# Patient Record
Sex: Female | Born: 1975 | Race: White | Hispanic: No | Marital: Married | Smoking: Never smoker
Health system: Southern US, Community
[De-identification: ages and names within clinical notes are randomized; demographics above are authoritative.]

## PROBLEM LIST (undated history)

## (undated) HISTORY — PX: TUBAL LIGATION: SHX77

---

## 1997-09-12 ENCOUNTER — Encounter: Admission: RE | Admit: 1997-09-12 | Discharge: 1997-09-12 | Payer: Self-pay | Admitting: Family Medicine

## 1997-09-14 ENCOUNTER — Encounter: Admission: RE | Admit: 1997-09-14 | Discharge: 1997-09-14 | Payer: Self-pay | Admitting: Family Medicine

## 1997-10-17 ENCOUNTER — Encounter: Admission: RE | Admit: 1997-10-17 | Discharge: 1997-10-17 | Payer: Self-pay | Admitting: Family Medicine

## 1998-06-19 ENCOUNTER — Encounter: Admission: RE | Admit: 1998-06-19 | Discharge: 1998-06-19 | Payer: Self-pay | Admitting: Family Medicine

## 2001-02-03 ENCOUNTER — Other Ambulatory Visit: Admission: RE | Admit: 2001-02-03 | Discharge: 2001-02-03 | Payer: Self-pay | Admitting: Obstetrics and Gynecology

## 2001-03-22 ENCOUNTER — Encounter: Payer: Self-pay | Admitting: Obstetrics and Gynecology

## 2001-03-22 ENCOUNTER — Ambulatory Visit (HOSPITAL_COMMUNITY): Admission: RE | Admit: 2001-03-22 | Discharge: 2001-03-22 | Payer: Self-pay | Admitting: Obstetrics and Gynecology

## 2001-03-25 ENCOUNTER — Encounter: Payer: Self-pay | Admitting: Obstetrics and Gynecology

## 2001-03-25 ENCOUNTER — Ambulatory Visit (HOSPITAL_COMMUNITY): Admission: RE | Admit: 2001-03-25 | Discharge: 2001-03-25 | Payer: Self-pay | Admitting: Obstetrics and Gynecology

## 2001-08-21 ENCOUNTER — Inpatient Hospital Stay (HOSPITAL_COMMUNITY): Admission: AD | Admit: 2001-08-21 | Discharge: 2001-08-23 | Payer: Self-pay | Admitting: Obstetrics and Gynecology

## 2001-08-24 ENCOUNTER — Encounter: Admission: RE | Admit: 2001-08-24 | Discharge: 2001-09-23 | Payer: Self-pay | Admitting: Obstetrics and Gynecology

## 2001-10-24 ENCOUNTER — Encounter: Admission: RE | Admit: 2001-10-24 | Discharge: 2001-11-23 | Payer: Self-pay | Admitting: Obstetrics and Gynecology

## 2002-07-25 ENCOUNTER — Other Ambulatory Visit: Admission: RE | Admit: 2002-07-25 | Discharge: 2002-07-25 | Payer: Self-pay | Admitting: Obstetrics and Gynecology

## 2002-09-12 ENCOUNTER — Ambulatory Visit (HOSPITAL_COMMUNITY): Admission: RE | Admit: 2002-09-12 | Discharge: 2002-09-12 | Payer: Self-pay | Admitting: Obstetrics and Gynecology

## 2002-09-12 ENCOUNTER — Encounter: Payer: Self-pay | Admitting: Obstetrics and Gynecology

## 2002-11-07 ENCOUNTER — Ambulatory Visit (HOSPITAL_COMMUNITY): Admission: RE | Admit: 2002-11-07 | Discharge: 2002-11-07 | Payer: Self-pay | Admitting: Obstetrics and Gynecology

## 2002-11-07 ENCOUNTER — Encounter: Payer: Self-pay | Admitting: Obstetrics and Gynecology

## 2002-11-22 ENCOUNTER — Encounter: Payer: Self-pay | Admitting: Obstetrics and Gynecology

## 2002-11-22 ENCOUNTER — Ambulatory Visit (HOSPITAL_COMMUNITY): Admission: RE | Admit: 2002-11-22 | Discharge: 2002-11-22 | Payer: Self-pay | Admitting: Obstetrics and Gynecology

## 2003-01-09 ENCOUNTER — Encounter: Payer: Self-pay | Admitting: Obstetrics and Gynecology

## 2003-01-09 ENCOUNTER — Ambulatory Visit (HOSPITAL_COMMUNITY): Admission: RE | Admit: 2003-01-09 | Discharge: 2003-01-09 | Payer: Self-pay | Admitting: Obstetrics and Gynecology

## 2003-01-23 ENCOUNTER — Inpatient Hospital Stay (HOSPITAL_COMMUNITY): Admission: AD | Admit: 2003-01-23 | Discharge: 2003-01-23 | Payer: Self-pay | Admitting: Obstetrics and Gynecology

## 2003-02-07 ENCOUNTER — Inpatient Hospital Stay (HOSPITAL_COMMUNITY): Admission: AD | Admit: 2003-02-07 | Discharge: 2003-02-07 | Payer: Self-pay | Admitting: Obstetrics and Gynecology

## 2003-02-12 ENCOUNTER — Inpatient Hospital Stay (HOSPITAL_COMMUNITY): Admission: AD | Admit: 2003-02-12 | Discharge: 2003-02-14 | Payer: Self-pay | Admitting: Obstetrics and Gynecology

## 2004-05-15 ENCOUNTER — Other Ambulatory Visit: Admission: RE | Admit: 2004-05-15 | Discharge: 2004-05-15 | Payer: Self-pay | Admitting: Obstetrics and Gynecology

## 2004-07-04 ENCOUNTER — Ambulatory Visit (HOSPITAL_COMMUNITY): Admission: RE | Admit: 2004-07-04 | Discharge: 2004-07-04 | Payer: Self-pay | Admitting: Obstetrics and Gynecology

## 2004-10-24 ENCOUNTER — Ambulatory Visit (HOSPITAL_COMMUNITY): Admission: RE | Admit: 2004-10-24 | Discharge: 2004-10-24 | Payer: Self-pay | Admitting: Obstetrics and Gynecology

## 2004-11-20 ENCOUNTER — Ambulatory Visit (HOSPITAL_COMMUNITY): Admission: RE | Admit: 2004-11-20 | Discharge: 2004-11-20 | Payer: Self-pay | Admitting: Obstetrics and Gynecology

## 2004-11-21 ENCOUNTER — Inpatient Hospital Stay (HOSPITAL_COMMUNITY): Admission: AD | Admit: 2004-11-21 | Discharge: 2004-11-21 | Payer: Self-pay | Admitting: Obstetrics and Gynecology

## 2004-11-21 ENCOUNTER — Ambulatory Visit (HOSPITAL_COMMUNITY): Admission: RE | Admit: 2004-11-21 | Discharge: 2004-11-21 | Payer: Self-pay | Admitting: Obstetrics and Gynecology

## 2004-11-22 ENCOUNTER — Inpatient Hospital Stay (HOSPITAL_COMMUNITY): Admission: AD | Admit: 2004-11-22 | Discharge: 2004-11-22 | Payer: Self-pay | Admitting: Obstetrics and Gynecology

## 2004-11-23 ENCOUNTER — Inpatient Hospital Stay (HOSPITAL_COMMUNITY): Admission: AD | Admit: 2004-11-23 | Discharge: 2004-11-25 | Payer: Self-pay | Admitting: Obstetrics and Gynecology

## 2005-12-19 IMAGING — US US OB COMP +14 WK
1 series · 13 of 28 positions shown · non-contrast
Comparison: none

CLINICAL DATA: Size greater than dates.   
No comparison. 
OBSTETRICAL ULTRASOUND:
Number of Fetuses:  1
Heart Rate:  141 bpm
Movement:  Yes 
Breathing:  Yes 
Presentation:  Cephalic 
Placental Location:  Posterior
Grade:  2
Previa:  No 
Amniotic Fluid (Subjective):  Normal 
Amniotic Fluid (Objective):   AFI 22.3 cm (4th-14th %ile = 7.7-24.9 cm for 36 weeks)

[Series 1: us ob comp +14 wk · 0.49mm/px · 13 of 54 slices shown]
[im 2/54]
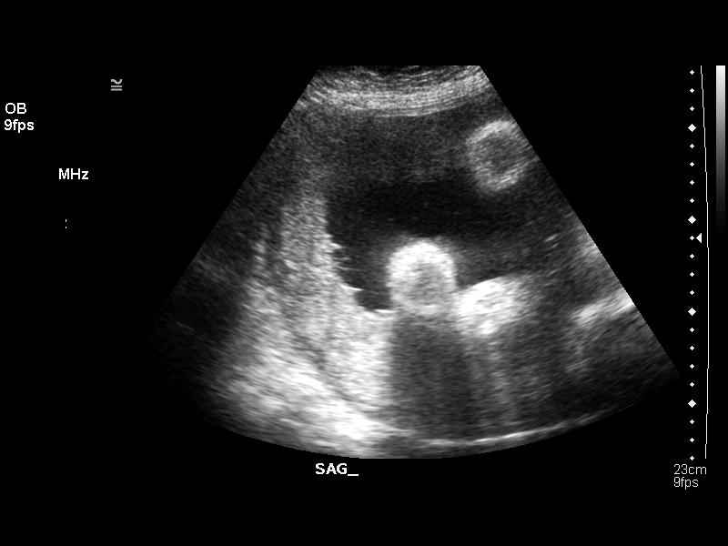
[im 6/54]
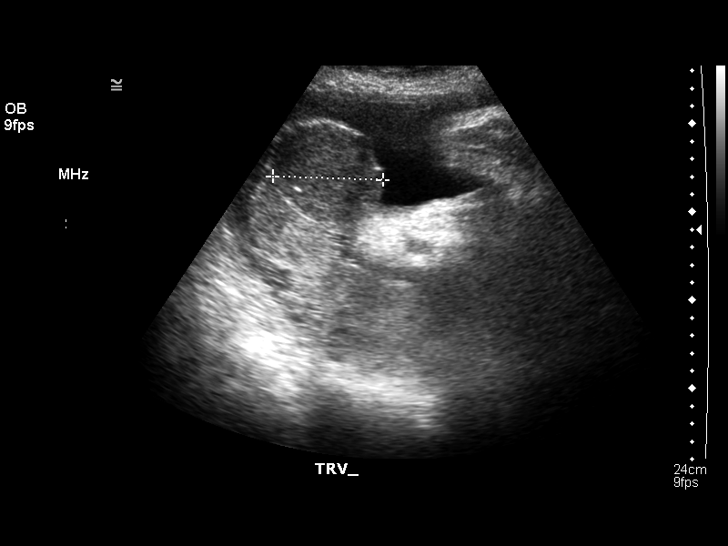
[im 10/54]
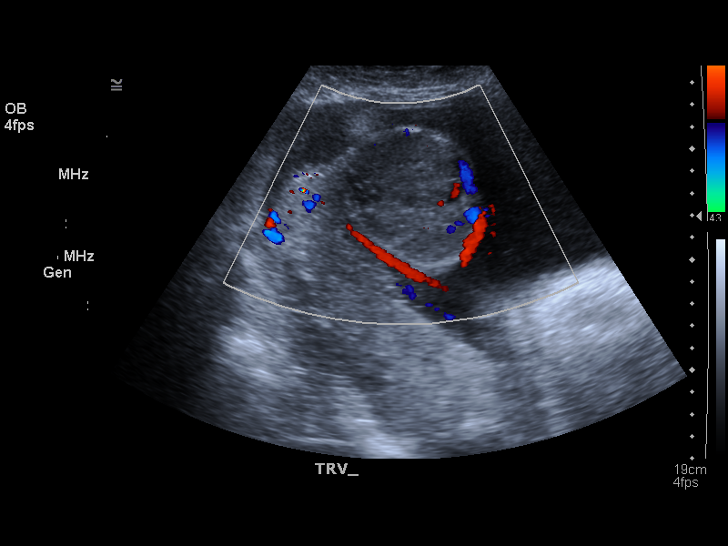
[im 14/54]
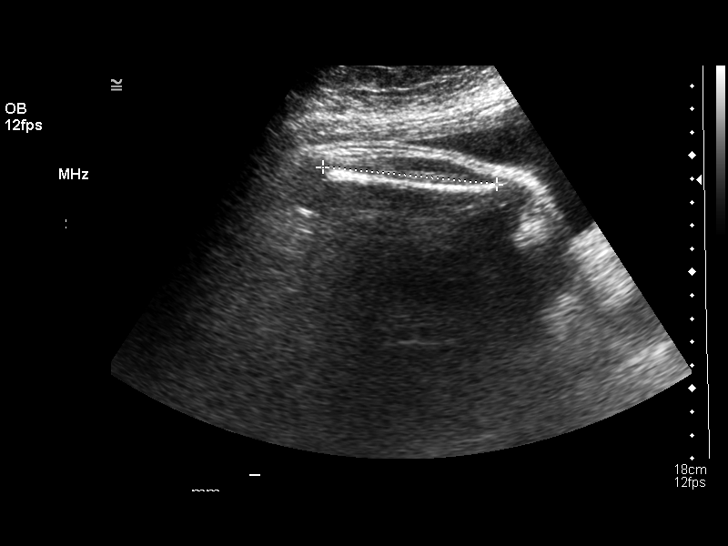
[im 18/54]
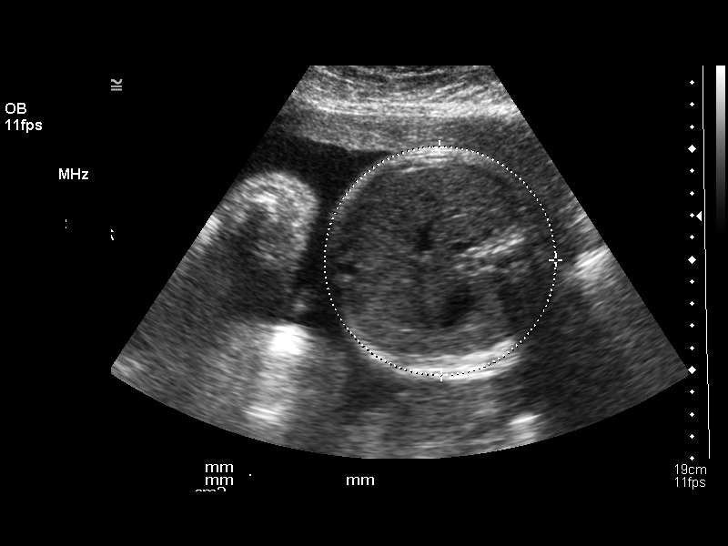
[im 22/54]
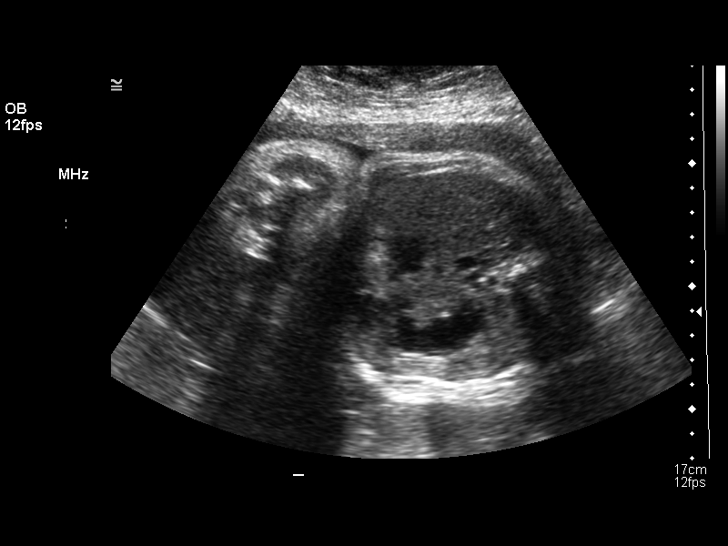
[im 28/54]
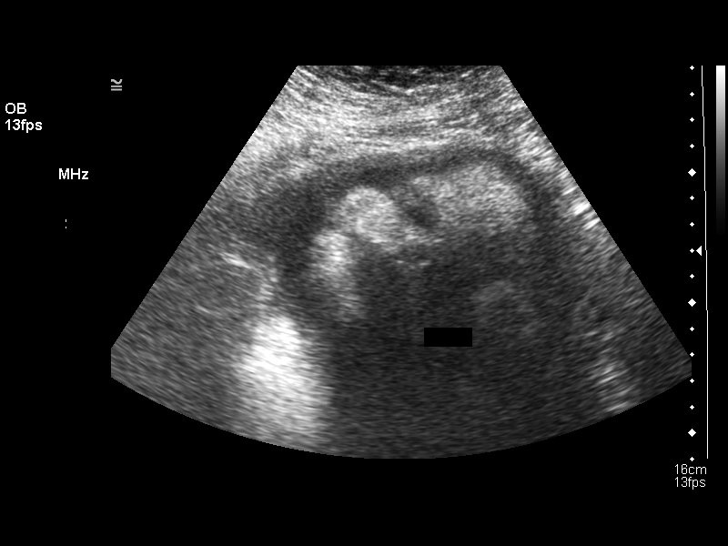
[im 32/54]
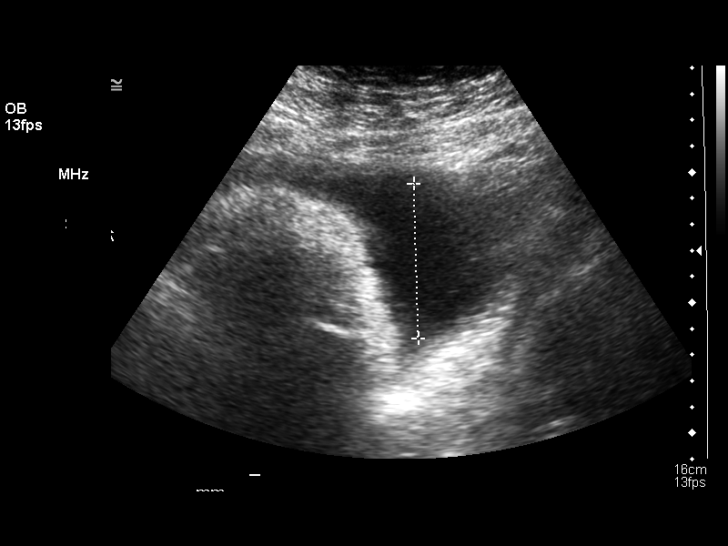
[im 36/54]
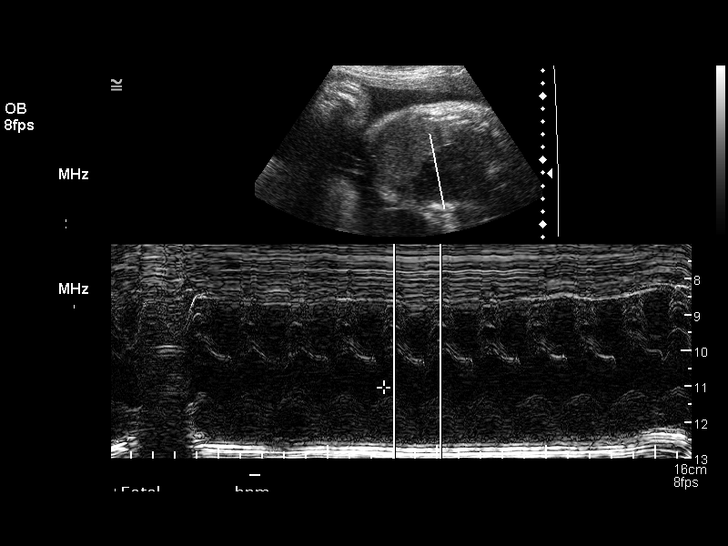
[im 40/54]
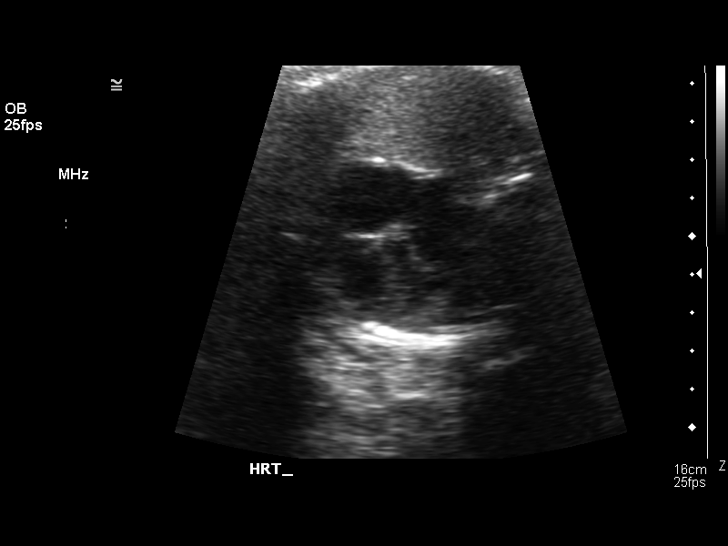
[im 44/54]
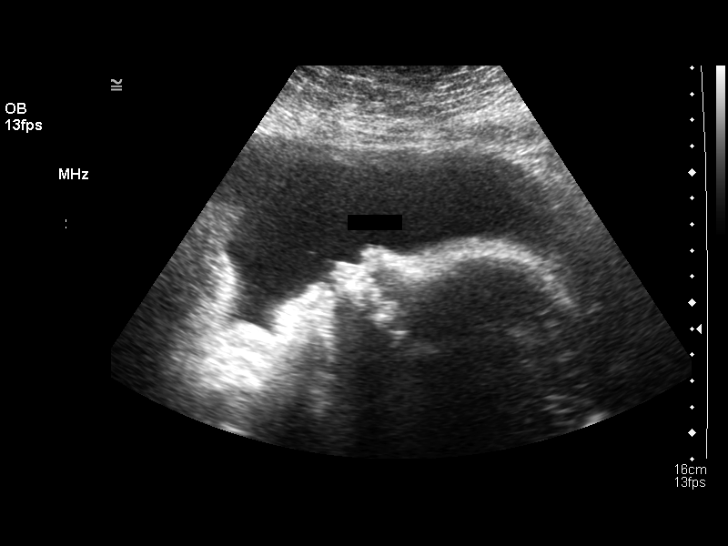
[im 48/54]
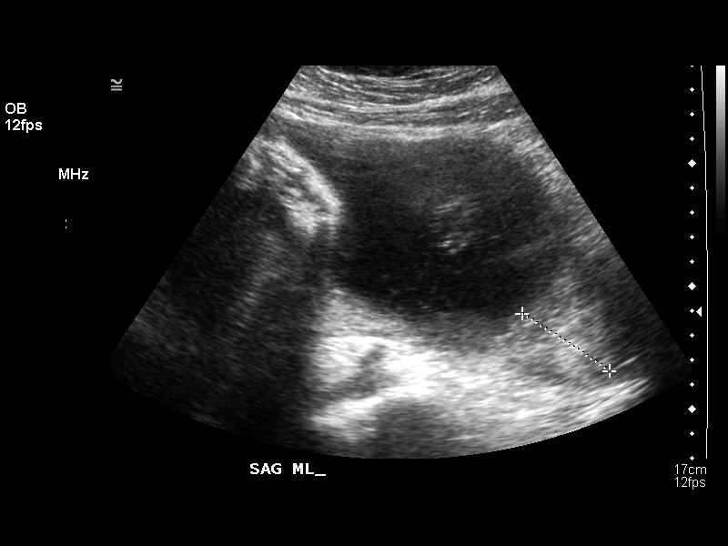
[im 52/54]
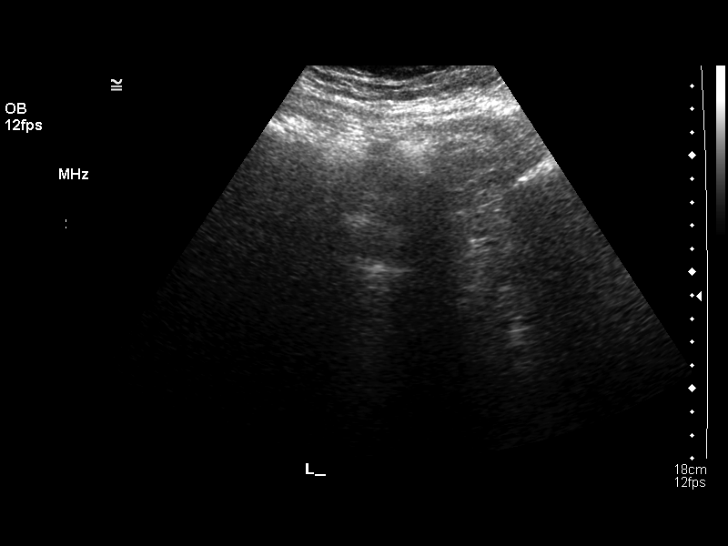

[13 of 28 positions shown; findings below may reference images not displayed]

FETAL BIOMETRY
BPD:  9.0 cm  36 w  4 d
HC:  33.0 cm  37 w  4 d
AC:  32.9 cm  36 w  6 d
FL:  7.4 cm  38 w  1 d

MEAN GA:  37 w  2 d 

EFW:  8571 grams (H) 75-90th %ile (2622-9390 g) for 36 weeks 

FETAL ANATOMY
Lateral Ventricles:  Visualized 
Thalami/CSP:    Visualized 
Posterior Fossa:  Not Visualized 
Nuchal Region:  Not Visualized 
Spine:    Not Visualized 
4 Chamber Heart on Left:    Visualized 
Stomach on Left:    Visualized 
3 Vessel Cord:  Not Visualized 
Cord Insertion site:  Not Visualized 
Kidneys:  Visualized 
Bladder:  Visualized 
Extremities:    Not Visualized 

ADDITIONAL ANATOMY VISUALIZED:  upper lip, diaphragm, female genitalia.

Evaluation limited by:  Maternal habitus, fetal position, advanced gestational age.   

MATERNAL UTERINE AND ADNEXAL FINDINGS
Cervix:   4.2 cm transabdominally
IMPRESSION: 1.  Single living intrauterine fetus in cephalic presentation with Subjectively and quantitatively normal amniotic fluid volume.   Estimated mean gestational age by ultrasound today is 37 weeks 2 days with approximately 1 week advance for the reported assigned gestational age.   

2.  6.4 x 5.5 x 6.2 cm vascular mass identified in the placenta, protruding into the amniotic cavity.   Doppler color flow signal is identified within the lesion on Doppler imaging.   Features are most suggestive of a chorioangioma.  While these lesions have been associated with polyhydramnios, again, the fluid volume appears within normal limits on today?s exam.   Other associations include fetal hydrops and high output cardiac failure in the fetus.   There is no evidence for skin thickening on the current exam.   One of our Jumper, [place], called this report to the on-call physician for Dr. Caldera at approximately 0908 hours on 10/24/04.

## 2006-01-15 IMAGING — US US FETAL BPP W/O NONSTRESS
1 series · 13 of 26 positions shown · non-contrast
Comparison: none

CLINICAL DATA: Non-reactive NST.

[Series 1: us fetal bpp w/o nonstress · 13 of 26 slices shown]
[im 2/26]
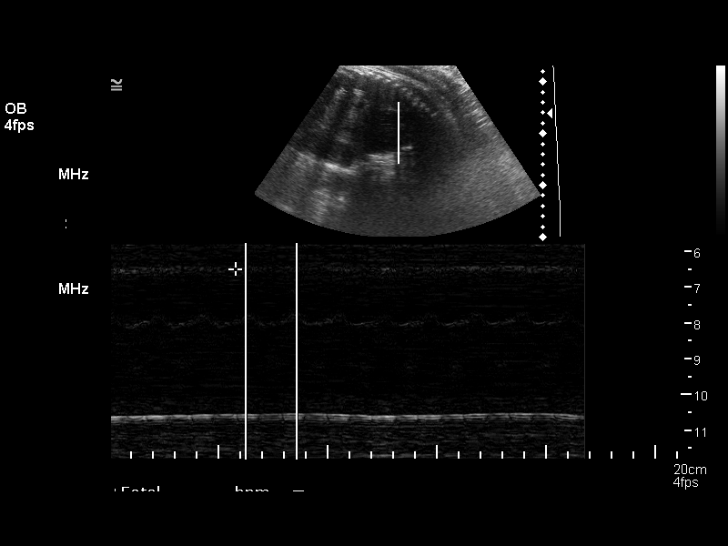
[im 4/26]
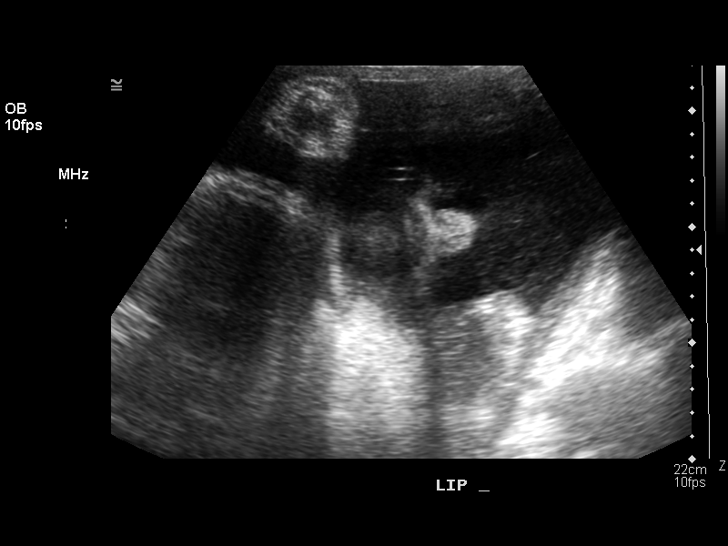
[im 6/26]
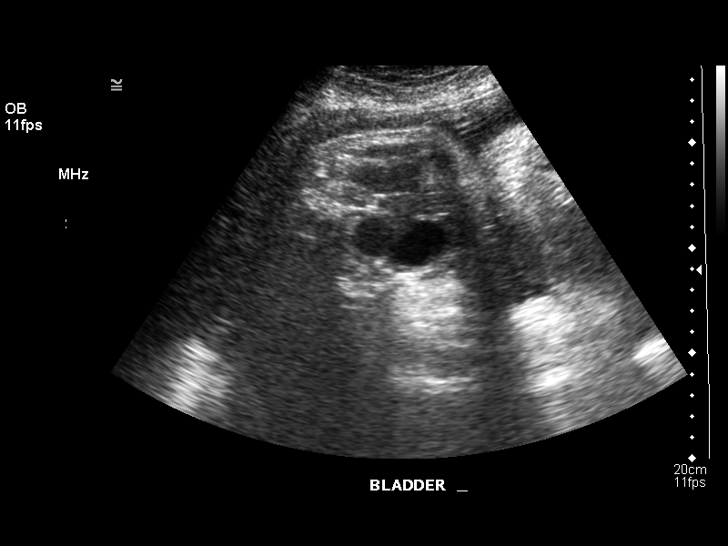
[im 8/26]
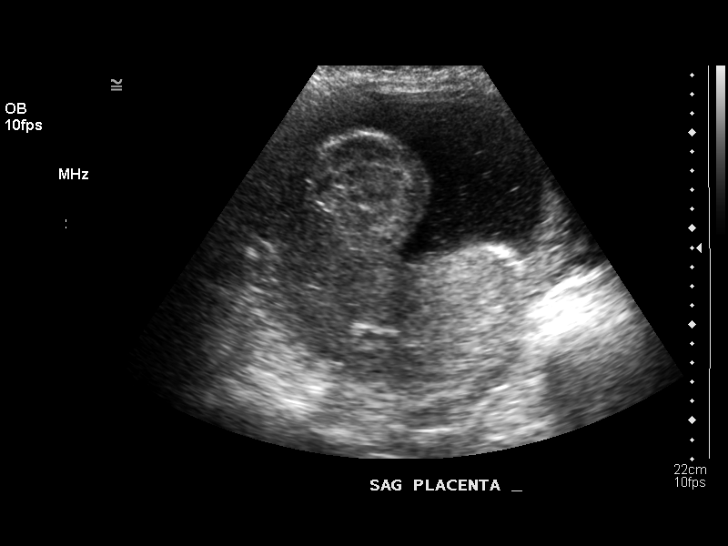
[im 10/26]
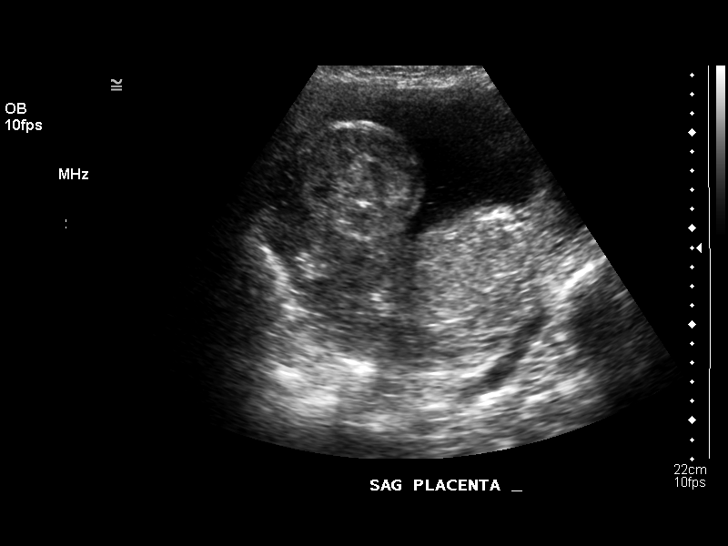
[im 12/26]
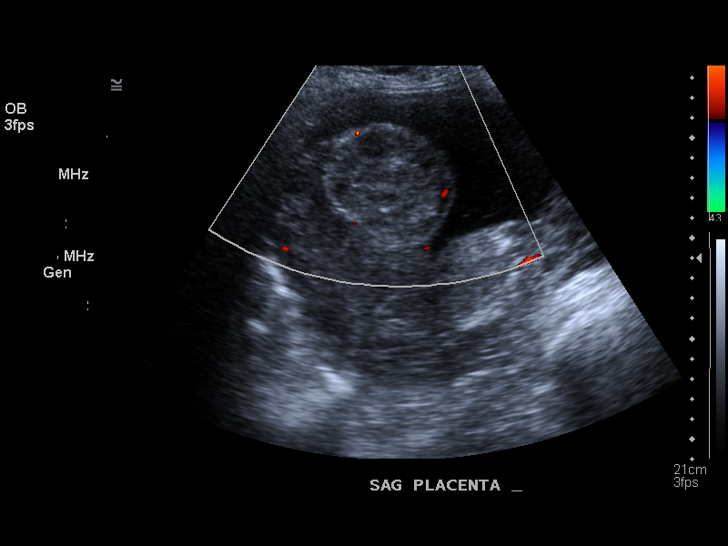
[im 14/26]
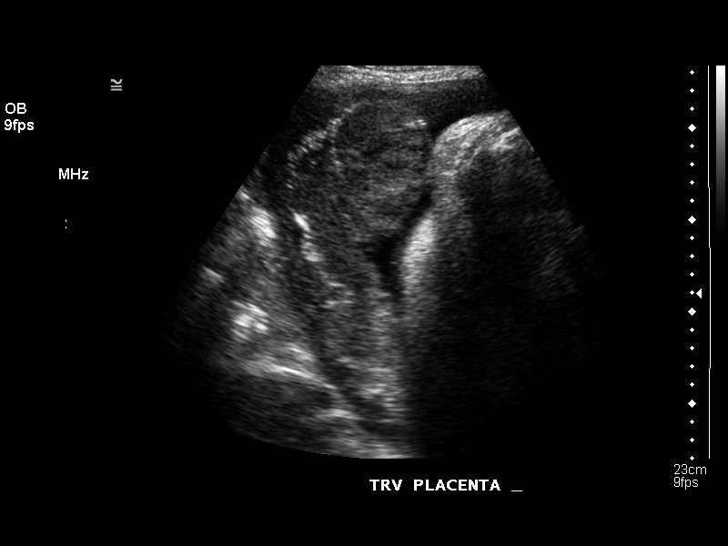
[im 16/26]
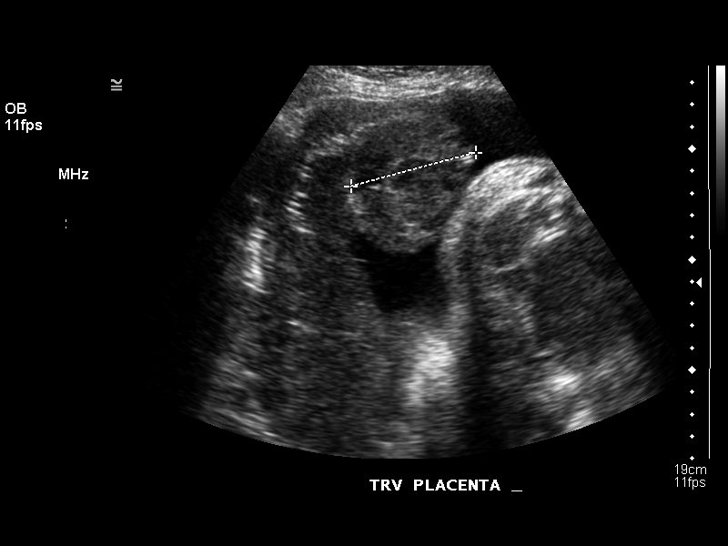
[im 18/26]
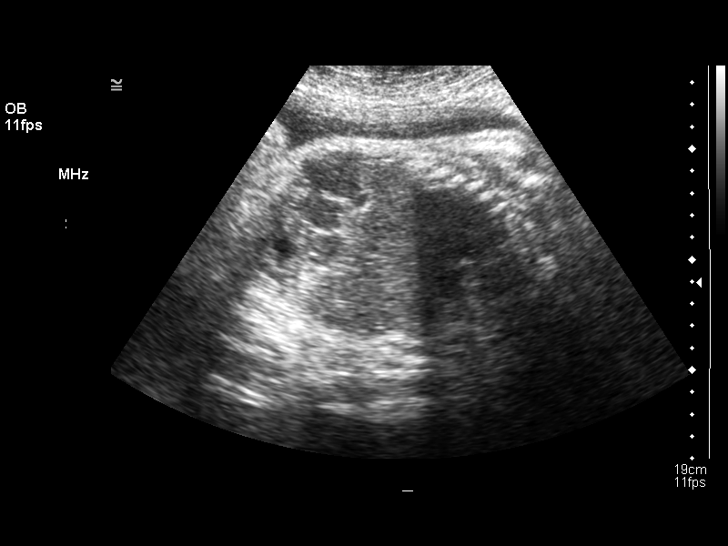
[im 20/26]
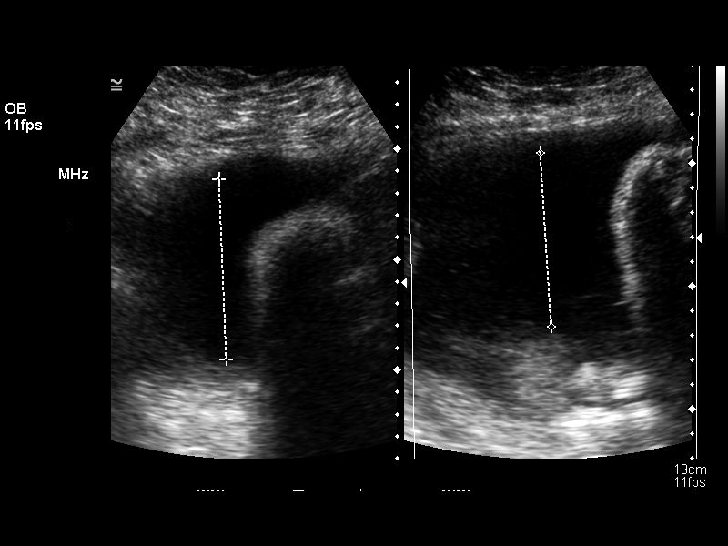
[im 22/26]
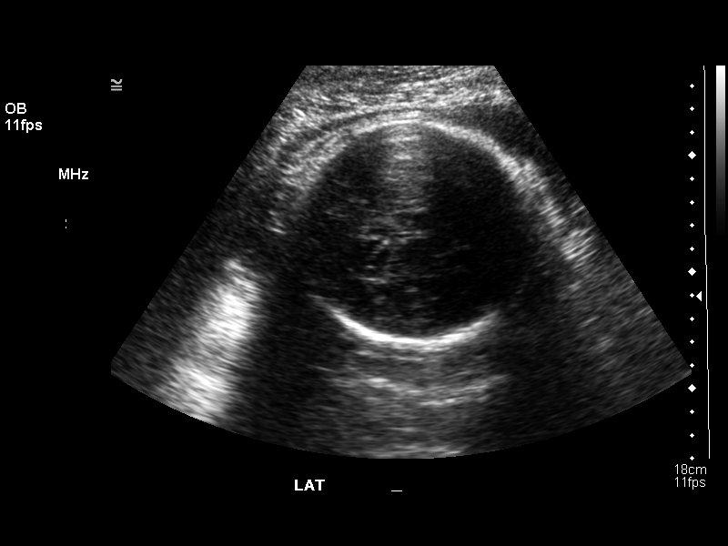
[im 24/26]
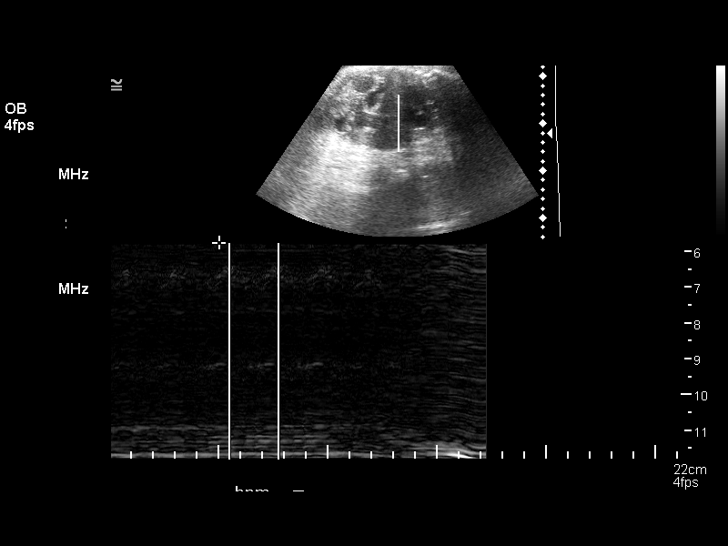
[im 26/26]
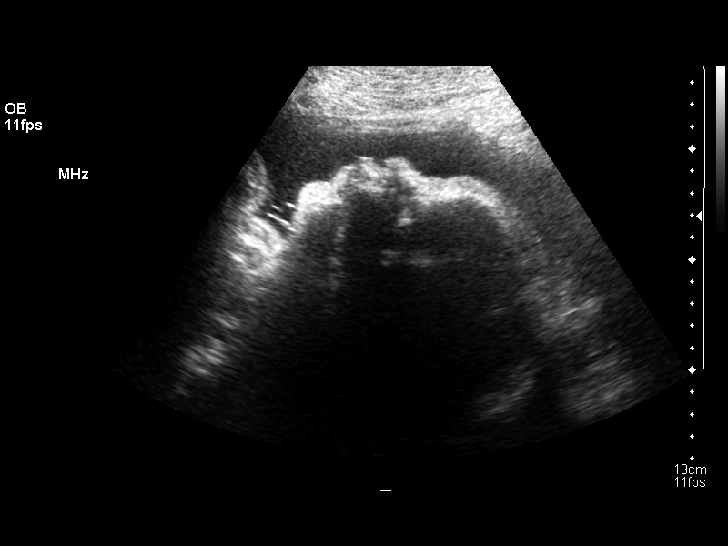

[13 of 26 positions shown; findings below may reference images not displayed]

BIOPHYSICAL PROFILE

 Number of Fetuses:  1
 Heart rate:  133
 Movement:  Yes
 Breathing:  No
 Presentation:  Cephalic
 Placental Location:  Posterior
 Grade:  III
 Previa:  No
 Amniotic Fluid (Subjective):  Increased
 Amniotic Fluid (Objective):  30.8 cm AFI (5th -95th%ile = 7.1 ? 21.4 cm for 40 wks)

 Fetal measurements and complete anatomic evaluation were not requested.  The following fetal anatomy was visualized on this exam: Lateral ventricles, kidneys and bladder.

 BPP SCORING
 Movements:  2  Time:  30 minutes
 Breathing:  0
 Tone:  2
 Amniotic Fluid:  2
 Total Score:  6

 MATERNAL UTERINE AND ADNEXAL FINDINGS
 Cervix:  Not evaluated
IMPRESSION: 1.  Single living intrauterine fetus in cephalic presentation with subjectively increased amniotic fluid volume for a term gestation.  The AFI is 30.8 cm which is above the 95th percentile for a 40 week gestation.
 2.  Biophysical profile score is [DATE] after 30 minutes of observation.  No sustained fetal breathing motion was observed.
 3.  Placental mass is again identified measuring up to 6.8 cm in maximum diameter.  There is evidence of associated Doppler signal on Color Doppler imaging suggesting flow within the lesion.  As described previously, these features remain compatible with a chorioangioma.

## 2010-05-18 ENCOUNTER — Encounter: Payer: Self-pay | Admitting: Obstetrics and Gynecology

## 2011-11-02 DIAGNOSIS — L259 Unspecified contact dermatitis, unspecified cause: Secondary | ICD-10-CM | POA: Insufficient documentation

## 2011-11-02 DIAGNOSIS — K589 Irritable bowel syndrome without diarrhea: Secondary | ICD-10-CM | POA: Insufficient documentation

## 2019-09-08 ENCOUNTER — Other Ambulatory Visit: Payer: Self-pay

## 2019-09-08 ENCOUNTER — Encounter: Payer: Self-pay | Admitting: Emergency Medicine

## 2019-09-08 ENCOUNTER — Ambulatory Visit
Admission: EM | Admit: 2019-09-08 | Discharge: 2019-09-08 | Disposition: A | Discharge status: Home / Self Care | Payer: Self-pay | Attending: Physician Assistant | Admitting: Physician Assistant

## 2019-09-08 DIAGNOSIS — S61011A Laceration without foreign body of right thumb without damage to nail, initial encounter: Secondary | ICD-10-CM

## 2019-09-08 NOTE — Discharge Instructions (Signed)
4 sutures placed. You can remove current dressing in 24 hours. Keep wound clean and dry. You can clean gently with soap and water. Do not soak area in water. Monitor for spreading redness, increased warmth, increased swelling, fever, follow up for reevaluation needed. Otherwise follow up in 7 days for suture removal.

## 2019-09-08 NOTE — ED Triage Notes (Signed)
Pt here with laceration to right thumb while working in yard; pt sts she pulling out metal stakes from ground

## 2019-09-08 NOTE — ED Provider Notes (Signed)
[facility]    CSN: [id number] Arrival date & time: 09/08/19  1849      History   Chief Complaint Chief Complaint  Patient presents with  . Laceration    HPI Victoria Malone is a 44 y.o. female.   44 year old female comes in for right thumb laceration shortly prior to arrival. Was working in the yard, pulling out metal stake when sustaining laceration. Washed wound with water, applied pressure and came in for evaluation. Denies numbness/tingling. Up to date on tetanus.      History reviewed. No pertinent past medical history.  There are no problems to display for this patient.   Past Surgical History:  Procedure Laterality Date  . TUBAL LIGATION      OB History   No obstetric history on file.      Home Medications    Prior to Admission medications   Not on File    Family History Family History  Problem Relation Age of Onset  . Cancer Father     Social History Social History   Tobacco Use  . Smoking status: Never Smoker  . Smokeless tobacco: Never Used  Substance Use Topics  . Alcohol use: Not Currently  . Drug use: Never     Allergies   Latex   Review of Systems Review of Systems  Reason unable to perform ROS: See HPI as above.     Physical Exam Triage Vital Signs ED Triage Vitals  Enc Vitals Group     BP 09/08/19 1901 (!) 157/84     Pulse Rate 09/08/19 1901 (!) 112     Resp 09/08/19 1901 20     Temp 09/08/19 1901 98.1 F (36.7 C)     Temp Source 09/08/19 1901 Oral     SpO2 09/08/19 1901 98 %     Weight --      Height --      Head Circumference --      Peak Flow --      Pain Score 09/08/19 1855 3     Pain Loc --      Pain Edu? --      Excl. in [place]? --    No data found.  Updated Vital Signs BP (!) 157/84 (BP Location: Left Arm)   Pulse (!) 112   Temp 98.1 F (36.7 C) (Oral)   Resp 20   SpO2 98%   Visual Acuity Right Eye Distance:   Left Eye Distance:   Bilateral Distance:    Right Eye Near:   Left  Eye Near:    Bilateral Near:     Physical Exam Constitutional:      General: She is not in acute distress.    Appearance: Normal appearance. She is well-developed. She is not toxic-appearing or diaphoretic.  HENT:     Head: Normocephalic and atraumatic.  Eyes:     Conjunctiva/sclera: Conjunctivae normal.     Pupils: Pupils are equal, round, and reactive to light.  Pulmonary:     Effort: Pulmonary effort is normal. No respiratory distress.     Comments: Speaking in full sentences without difficulty Musculoskeletal:       Hands:     Cervical back: Normal range of motion and neck supple.     Comments: Irregular shaped laceration approx 3cm to the medial thumb along PIP to PIP joint. Bleeding controlled with pressure. Full ROM of finger. Strength 5/5. NVI  Skin:    General: Skin is warm and  dry.  Neurological:     Mental Status: She is alert and oriented to person, place, and time.      UC Treatments / Results  Labs (all labs ordered are listed, but only abnormal results are displayed) Labs Reviewed - No data to display  EKG   Radiology No results found.  Procedures Laceration Repair  Date/Time: 09/08/2019 8:00 PM Performed by: Belinda Fisher, PA-C Authorized by: Belinda Fisher, PA-C   Consent:    Consent obtained:  Verbal   Consent given by:  Patient   Risks discussed:  Pain, infection, poor cosmetic result, poor wound healing and nerve damage   Alternatives discussed:  Referral and no treatment Anesthesia (see MAR for exact dosages):    Anesthesia method:  Nerve block   Block location:  Right thumb   Block needle gauge:  27 G   Block anesthetic:  Lidocaine 2% w/o epi   Block injection procedure:  Anatomic landmarks identified, introduced needle, incremental injection, anatomic landmarks palpated and negative aspiration for blood   Block outcome:  Anesthesia achieved Laceration details:    Location:  Finger   Finger location:  R thumb   Length (cm):  3   Depth (mm):   3 Repair type:    Repair type:  Simple Pre-procedure details:    Preparation:  Patient was prepped and draped in usual sterile fashion Exploration:    Hemostasis achieved with:  Direct pressure   Wound exploration: wound explored through full range of motion and entire depth of wound probed and visualized     Contaminated: no   Treatment:    Area cleansed with:  Saline   Amount of cleaning:  Standard   Irrigation solution:  Sterile saline   Irrigation method:  Pressure wash   Visualized foreign bodies/material removed: no   Skin repair:    Repair method:  Sutures   Suture size:  5-0   Suture material:  Prolene   Suture technique: 3 horizontal, 1 simple interrupted. Approximation:    Approximation:  Close Post-procedure details:    Dressing:  Antibiotic ointment and bulky dressing   Patient tolerance of procedure:  Tolerated well, no immediate complications   (including critical care time)  Medications Ordered in UC Medications - No data to display  Initial Impression / Assessment and Plan / UC Course  I have reviewed the triage vital signs and the nursing notes.  Pertinent labs & imaging results that were available during my care of the patient were reviewed by me and considered in my medical decision making (see chart for details).  Patient tolerated procedure well. 3 horizontal mattress, 1 simple interrupted sutures placed. Wound care instructions given. Return precautions given. Otherwise, follow up in 7 days for suture removal. Patient expresses understanding and agrees to plan.   Final Clinical Impressions(s) / UC Diagnoses   Final diagnoses:  Laceration of right thumb, foreign body presence unspecified, nail damage status unspecified, initial encounter    ED Prescriptions    None     PDMP not reviewed this encounter.   Belinda Fisher, PA-C 09/08/19 2002

## 2019-09-18 ENCOUNTER — Ambulatory Visit
Admission: EM | Admit: 2019-09-18 | Discharge: 2019-09-18 | Disposition: A | Discharge status: Home / Self Care | Payer: Self-pay

## 2019-09-18 DIAGNOSIS — Z4802 Encounter for removal of sutures: Secondary | ICD-10-CM

## 2019-09-18 NOTE — ED Triage Notes (Signed)
Removed 4 sutures from rt thumb. No s/sx's of injection.

## 2024-04-24 ENCOUNTER — Ambulatory Visit
Admission: EM | Admit: 2024-04-24 | Discharge: 2024-04-24 | Disposition: A | Discharge status: Home / Self Care | Payer: Self-pay | Attending: Family Medicine | Admitting: Family Medicine

## 2024-04-24 ENCOUNTER — Ambulatory Visit: Payer: Self-pay

## 2024-04-24 ENCOUNTER — Encounter: Payer: Self-pay | Admitting: Emergency Medicine

## 2024-04-24 DIAGNOSIS — R051 Acute cough: Secondary | ICD-10-CM

## 2024-04-24 DIAGNOSIS — R062 Wheezing: Secondary | ICD-10-CM

## 2024-04-24 DIAGNOSIS — J069 Acute upper respiratory infection, unspecified: Secondary | ICD-10-CM

## 2024-04-24 LAB — POC SOFIA SARS ANTIGEN FIA: SARS Coronavirus 2 Ag: NEGATIVE

## 2024-04-24 MED ORDER — DOXYCYCLINE HYCLATE 100 MG PO CAPS: 100.0000 mg | ORAL_CAPSULE | Freq: Two times a day (BID) | ORAL | 0 refills | Status: AC

## 2024-04-24 MED ORDER — ALBUTEROL SULFATE HFA 108 (90 BASE) MCG/ACT IN AERS: 2.0000 | INHALATION_SPRAY | RESPIRATORY_TRACT | 0 refills | Status: AC | PRN

## 2024-04-24 MED ORDER — PREDNISONE 20 MG PO TABS: 40.0000 mg | ORAL_TABLET | Freq: Every day | ORAL | 0 refills | Status: AC

## 2024-04-24 MED ORDER — BENZONATATE 100 MG PO CAPS: 100.0000 mg | ORAL_CAPSULE | Freq: Three times a day (TID) | ORAL | 0 refills | Status: AC | PRN

## 2024-04-24 NOTE — ED Triage Notes (Signed)
 Pt reports bilateral ear fullness since 12/01. States she flew out to [place]  and ears would not pop on plane since then intermittent popping noises in both ears with no full relief. Symptoms accompanied by sinus pressure and productive cough over the last week with 2-3 days of fevers at the beginning of the week. Ibuprofen used with little relief.

## 2024-04-24 NOTE — Discharge Instructions (Signed)
 Your chest x-ray by my review shows a possible pneumonia in the left lung.The radiologist will also read your x-ray, and if their interpretation differs significantly from mine, and the management of your condition would change, we will call you.  Your COVID test was negative  Albuterol inhaler--do 2 puffs every 4 hours as needed for shortness of breath or wheezing  Take prednisone 20 mg--2 daily for 5 days  Take doxycycline 100 mg --1 capsule 2 times daily for 7 days  Take benzonatate 100 mg, 1 tab every 8 hours as needed for cough.

## 2024-04-24 NOTE — ED Provider Notes (Signed)
 " [facility]    CSN: [id number] Arrival date & time: 04/24/24  1443      History   Chief Complaint Chief Complaint  Patient presents with   Ear Fullness   sinus pressure    HPI Victoria Malone is a 48 y.o. female.    Ear Fullness  Here for cough and congestion and wheezing.  Symptoms began on December 25.  She had fever initially and that has resolved since yesterday.  Temperature was as high as 104  No nausea vomiting or diarrhea.  Prior to December 25 she had had some congestion in her throat and some coughing.  Since December 1 she had some bubbling sensation and fullness in her ears, mainly her right when.  No known history of asthma but she does have a family history of asthma.  Last menstrual cycle was October 31  Her only allergy is to latex   History reviewed. No pertinent past medical history.  Patient Active Problem List   Diagnosis Date Noted   Contact dermatitis 11/02/2011   IBS (irritable bowel syndrome) 11/02/2011    Past Surgical History:  Procedure Laterality Date   TUBAL LIGATION      OB History   No obstetric history on file.      Home Medications    Prior to Admission medications  Medication Sig Start Date End Date Taking? Authorizing Provider  albuterol (VENTOLIN HFA) 108 (90 Base) MCG/ACT inhaler Inhale 2 puffs into the lungs every 4 (four) hours as needed for wheezing or shortness of breath. 04/24/24  Yes Neftaly Swiss K, MD  benzonatate (TESSALON) 100 MG capsule Take 1 capsule (100 mg total) by mouth 3 (three) times daily as needed for cough. 04/24/24  Yes Alcario Tinkey K, MD  doxycycline (VIBRAMYCIN) 100 MG capsule Take 1 capsule (100 mg total) by mouth 2 (two) times daily for 7 days. 04/24/24 05/01/24 Yes Rylan Kaufmann K, MD  predniSONE (DELTASONE) 20 MG tablet Take 2 tablets (40 mg total) by mouth daily with breakfast for 5 days. 04/24/24 04/29/24 Yes Vonna Sharlet POUR, MD    Family History Family History   Problem Relation Age of Onset   Cancer Father     Social History Social History[1]   Allergies   Latex   Review of Systems Review of Systems   Physical Exam Triage Vital Signs ED Triage Vitals  Encounter Vitals Group     BP 04/24/24 1545 119/83     Girls Systolic BP Percentile --      Girls Diastolic BP Percentile --      Boys Systolic BP Percentile --      Boys Diastolic BP Percentile --      Pulse Rate 04/24/24 1545 82     Resp 04/24/24 1545 20     Temp 04/24/24 1545 98.5 F (36.9 C)     Temp Source 04/24/24 1545 Oral     SpO2 04/24/24 1545 98 %     Weight --      Height --      Head Circumference --      Peak Flow --      Pain Score 04/24/24 1542 4     Pain Loc --      Pain Education --      Exclude from Growth Chart --    No data found.  Updated Vital Signs BP 119/83 (BP Location: Left Arm)   Pulse 82   Temp 98.5 F (36.9 C) (Oral)  Resp 20   LMP 02/25/2024 (Approximate)   SpO2 98%   Visual Acuity Right Eye Distance:   Left Eye Distance:   Bilateral Distance:    Right Eye Near:   Left Eye Near:    Bilateral Near:     Physical Exam Vitals reviewed.  Constitutional:      General: She is not in acute distress.    Appearance: She is not ill-appearing, toxic-appearing or diaphoretic.  HENT:     Right Ear: Tympanic membrane and ear canal normal.     Left Ear: Tympanic membrane and ear canal normal.     Nose: Congestion present.     Mouth/Throat:     Mouth: Mucous membranes are moist.     Comments: There is clear mucus in the oropharynx Eyes:     Extraocular Movements: Extraocular movements intact.     Conjunctiva/sclera: Conjunctivae normal.     Pupils: Pupils are equal, round, and reactive to light.  Cardiovascular:     Rate and Rhythm: Normal rate and regular rhythm.     Heart sounds: No murmur heard. Pulmonary:     Effort: No respiratory distress.     Breath sounds: No stridor. No rhonchi or rales.     Comments: Cannot hear any  wheezing on lung exam, but she has a very tight wheezy cough.  She had a spasm with coughing while I was examining her Musculoskeletal:     Cervical back: Neck supple.  Lymphadenopathy:     Cervical: No cervical adenopathy.  Skin:    Capillary Refill: Capillary refill takes less than 2 seconds.     Coloration: Skin is not jaundiced or pale.  Neurological:     General: No focal deficit present.     Mental Status: She is alert and oriented to person, place, and time.  Psychiatric:        Behavior: Behavior normal.      UC Treatments / Results  Labs (all labs ordered are listed, but only abnormal results are displayed) Labs Reviewed  POC SOFIA SARS ANTIGEN FIA - Normal    EKG   Radiology No results found.  Procedures Procedures (including critical care time)  Medications Ordered in UC Medications - No data to display  Initial Impression / Assessment and Plan / UC Course  I have reviewed the triage vital signs and the nursing notes.  Pertinent labs & imaging results that were available during my care of the patient were reviewed by me and considered in my medical decision making (see chart for details).     COVID test is negative  I did not do a flu test as she has been ill for 4 days or more.  By my review the x-ray shows a possible infiltrate on the left lower lobe, blurring the borders of her heart shadow.  She is advised of radiology overread.  Albuterol inhaler and prednisone are sent in for what appears to be an asthma exacerbation.  Doxycycline is sent in for possible pneumonia. Tessalon Perles are sent in for cough Final Clinical Impressions(s) / UC Diagnoses   Final diagnoses:  Viral URI  Acute cough  Wheezing     Discharge Instructions      Your chest x-ray by my review shows a possible pneumonia in the left lung.The radiologist will also read your x-ray, and if their interpretation differs significantly from mine, and the management of your  condition would change, we will call you.  Your COVID test was negative  Albuterol inhaler--do 2 puffs every 4 hours as needed for shortness of breath or wheezing  Take prednisone 20 mg--2 daily for 5 days  Take doxycycline 100 mg --1 capsule 2 times daily for 7 days  Take benzonatate 100 mg, 1 tab every 8 hours as needed for cough.         ED Prescriptions     Medication Sig Dispense Auth. Provider   albuterol (VENTOLIN HFA) 108 (90 Base) MCG/ACT inhaler Inhale 2 puffs into the lungs every 4 (four) hours as needed for wheezing or shortness of breath. 1 each Vonna Sharlet POUR, MD   predniSONE (DELTASONE) 20 MG tablet Take 2 tablets (40 mg total) by mouth daily with breakfast for 5 days. 10 tablet Alek Borges K, MD   doxycycline (VIBRAMYCIN) 100 MG capsule Take 1 capsule (100 mg total) by mouth 2 (two) times daily for 7 days. 14 capsule Chanon Loney K, MD   benzonatate (TESSALON) 100 MG capsule Take 1 capsule (100 mg total) by mouth 3 (three) times daily as needed for cough. 21 capsule Adil Tugwell K, MD      PDMP not reviewed this encounter.    [1]  Social History Tobacco Use   Smoking status: Never   Smokeless tobacco: Never  Vaping Use   Vaping status: Never Used  Substance Use Topics   Alcohol use: Not Currently   Drug use: Never     Vonna Sharlet POUR, MD 04/24/24 1756  "
